# Patient Record
Sex: Female | Born: 1975 | Hispanic: Yes | Marital: Married | Smoking: Never smoker
Health system: Southern US, Community
[De-identification: ages and names within clinical notes are randomized; demographics above are authoritative.]

---

## 2006-07-02 ENCOUNTER — Ambulatory Visit: Payer: Self-pay | Admitting: Family Medicine

## 2006-10-26 ENCOUNTER — Inpatient Hospital Stay: Payer: Self-pay

## 2012-05-20 ENCOUNTER — Emergency Department: Payer: Self-pay | Admitting: Emergency Medicine

## 2012-05-20 LAB — COMPREHENSIVE METABOLIC PANEL
Albumin: 4.1 g/dL (ref 3.4–5.0)
Anion Gap: 9 (ref 7–16)
BUN: 7 mg/dL (ref 7–18)
Bilirubin,Total: 0.3 mg/dL (ref 0.2–1.0)
Calcium, Total: 8.8 mg/dL (ref 8.5–10.1)
Chloride: 107 mmol/L (ref 98–107)
Co2: 25 mmol/L (ref 21–32)
EGFR (African American): >60
Glucose: 88 mg/dL (ref 65–99)
Osmolality: 279 (ref 275–301)
Potassium: 3.3 mmol/L — ABNORMAL LOW (ref 3.5–5.1)
SGOT(AST): 16 U/L (ref 15–37)
Sodium: 141 mmol/L (ref 136–145)
Total Protein: 7.8 g/dL (ref 6.4–8.2)

## 2012-05-20 LAB — URINALYSIS, COMPLETE
Bacteria: NONE SEEN
Bilirubin,UR: NEGATIVE
Blood: NEGATIVE
Glucose,UR: NEGATIVE mg/dL (ref 0–75)
Squamous Epithelial: 3

## 2012-05-20 LAB — CBC WITH DIFFERENTIAL/PLATELET
Basophil #: 0 10*3/uL (ref 0.0–0.1)
Basophil %: 0.3 %
Eosinophil #: 0.1 10*3/uL (ref 0.0–0.7)
Eosinophil %: 0.5 %
HGB: 14.4 g/dL (ref 12.0–16.0)
Lymphocyte %: 17.5 %
Monocyte %: 5.9 %
Neutrophil #: 8 10*3/uL — ABNORMAL HIGH (ref 1.4–6.5)
Neutrophil %: 75.8 %
RBC: 5.08 10*6/uL (ref 3.80–5.20)
RDW: 13.1 % (ref 11.5–14.5)

## 2012-05-20 LAB — LIPASE, BLOOD: Lipase: 137 U/L (ref 73–393)

## 2018-11-18 ENCOUNTER — Other Ambulatory Visit: Payer: Self-pay

## 2018-11-18 ENCOUNTER — Encounter (INDEPENDENT_AMBULATORY_CARE_PROVIDER_SITE_OTHER): Payer: Self-pay

## 2018-11-18 ENCOUNTER — Ambulatory Visit: Payer: Self-pay | Attending: Oncology

## 2018-11-18 VITALS — BP 111/74 | HR 79 | Temp 98.1°F | Ht 61.0 in | Wt 104.0 lb

## 2018-11-18 DIAGNOSIS — N63 Unspecified lump in unspecified breast: Secondary | ICD-10-CM

## 2018-11-18 NOTE — Progress Notes (Signed)
  Subjective:     Patient ID: Madeline Ryan, female   DOB: 05-Nov-1975, 43 y.o.   MRN: [id number]  HPI   Review of Systems     Objective:   Physical Exam Chest:     Breasts:        Right: Mass present. No swelling, bleeding, inverted nipple, nipple discharge, skin change or tenderness.        Left: No swelling, bleeding, inverted nipple, mass, nipple discharge, skin change or tenderness.          Assessment:     43 year old hispanic patient presents for [facility] clinic visit.  Referred to [facility] from [facility] for 2 cm. breast mass in right breast.   Patient screened, and meets [facility] eligibility.  Patient does not have insurance, Medicare or Medicaid.  Handout given on Affordable Care Act.  Instructed patient on breast self awareness using teach back method.  Patient reports mass has been there and unchanged for about a year. Palpated a 2 cm. Firm mobile Mass at 12 o'clock right breast.    Plan:     Scheduled for diagnostic mammogram, and ultrasound on 11/19/2018.

## 2018-11-19 ENCOUNTER — Ambulatory Visit
Admission: RE | Admit: 2018-11-19 | Discharge: 2018-11-19 | Disposition: A | Discharge status: Home / Self Care | Payer: Self-pay | Source: Ambulatory Visit | Attending: Oncology | Admitting: Oncology

## 2018-11-19 DIAGNOSIS — N63 Unspecified lump in unspecified breast: Secondary | ICD-10-CM | POA: Insufficient documentation

## 2018-11-19 HISTORY — PX: BREAST BIOPSY: SHX20

## 2018-11-20 ENCOUNTER — Other Ambulatory Visit: Payer: Self-pay

## 2018-11-20 DIAGNOSIS — N63 Unspecified lump in unspecified breast: Secondary | ICD-10-CM

## 2018-11-20 NOTE — Progress Notes (Signed)
Orders in for US guided right breast biopsy.

## 2018-11-25 ENCOUNTER — Other Ambulatory Visit: Payer: Self-pay

## 2018-11-25 ENCOUNTER — Ambulatory Visit
Admission: RE | Admit: 2018-11-25 | Discharge: 2018-11-25 | Disposition: A | Discharge status: Home / Self Care | Payer: Self-pay | Source: Ambulatory Visit | Attending: Oncology | Admitting: Oncology

## 2018-11-25 DIAGNOSIS — N63 Unspecified lump in unspecified breast: Secondary | ICD-10-CM

## 2018-11-26 LAB — SURGICAL PATHOLOGY

## 2018-11-28 NOTE — Progress Notes (Addendum)
Radiologist notified patient of benign biopsy results.  Patient to return in one year for annual screening.  Copy to [facility].

## 2019-11-22 ENCOUNTER — Ambulatory Visit: Payer: Self-pay | Attending: Internal Medicine

## 2019-11-22 DIAGNOSIS — Z23 Encounter for immunization: Secondary | ICD-10-CM

## 2019-11-22 NOTE — Progress Notes (Signed)
   Covid-19 Vaccination Clinic  Name:  Siren Porrata    MRN: [id number] DOB: 09/21/1975  11/22/2019  Ms. Tobin Cadiente was observed post Covid-19 immunization for 15 minutes without incident. She was provided with Vaccine Information Sheet and instruction to access the V-Safe system.   Ms. Erminie Foulks was instructed to call 911 with any severe reactions post vaccine: Marland Kitchen Difficulty breathing  . Swelling of face and throat  . A fast heartbeat  . A bad rash all over body  . Dizziness and weakness   Immunizations Administered    Name Date Dose VIS Date Route   Pfizer COVID-19 Vaccine 11/22/2019  3:57 PM 0.3 mL 08/15/2019 Intramuscular   Manufacturer: [employer], [employer]   Lot: [id number]   NDC: [address]-7

## 2019-12-13 ENCOUNTER — Ambulatory Visit: Payer: Self-pay | Attending: Internal Medicine

## 2019-12-13 DIAGNOSIS — Z23 Encounter for immunization: Secondary | ICD-10-CM

## 2019-12-13 NOTE — Progress Notes (Signed)
   Covid-19 Vaccination Clinic  Name:  Madeline Ryan    MRN: [id number] DOB: 27-Nov-1975  12/13/2019  Ms. Madeline Ryan was observed post Covid-19 immunization for 15 minutes without incident. She was provided with Vaccine Information Sheet and instruction to access the V-Safe system.   Ms. Madeline Ryan was instructed to call 911 with any severe reactions post vaccine: Marland Kitchen Difficulty breathing  . Swelling of face and throat  . A fast heartbeat  . A bad rash all over body  . Dizziness and weakness   Immunizations Administered    Name Date Dose VIS Date Route   Pfizer COVID-19 Vaccine 12/13/2019  3:00 PM 0.3 mL 08/15/2019 Intramuscular   Manufacturer: [employer], [employer]   Lot: [id number]   NDC: [address]-5

## 2020-07-22 IMAGING — MG MM BREAST LOCALIZATION CLIP RIGHT
2 series · 2 of 2 positions shown · non-contrast
Comparison: Previous exam(s).

CLINICAL DATA: Evaluate biopsy marker

EXAM:
DIAGNOSTIC RIGHT MAMMOGRAM POST ULTRASOUND BIOPSY

[R CC]
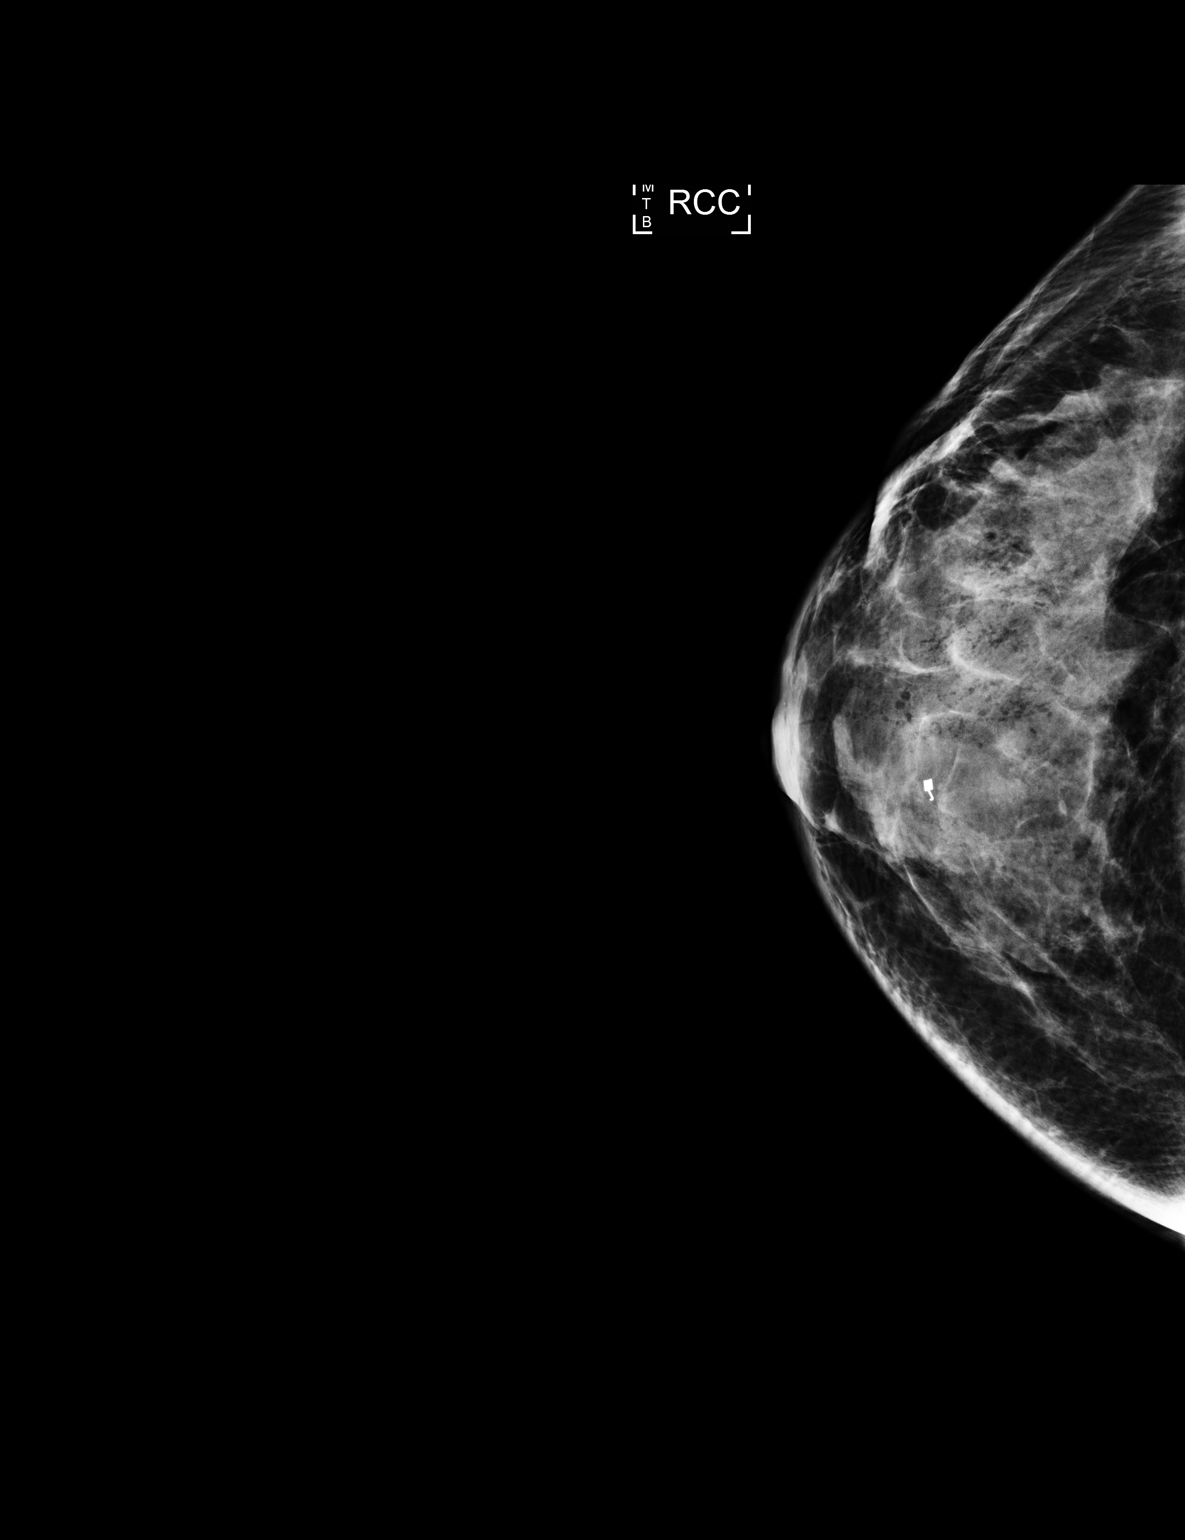

[R ML]
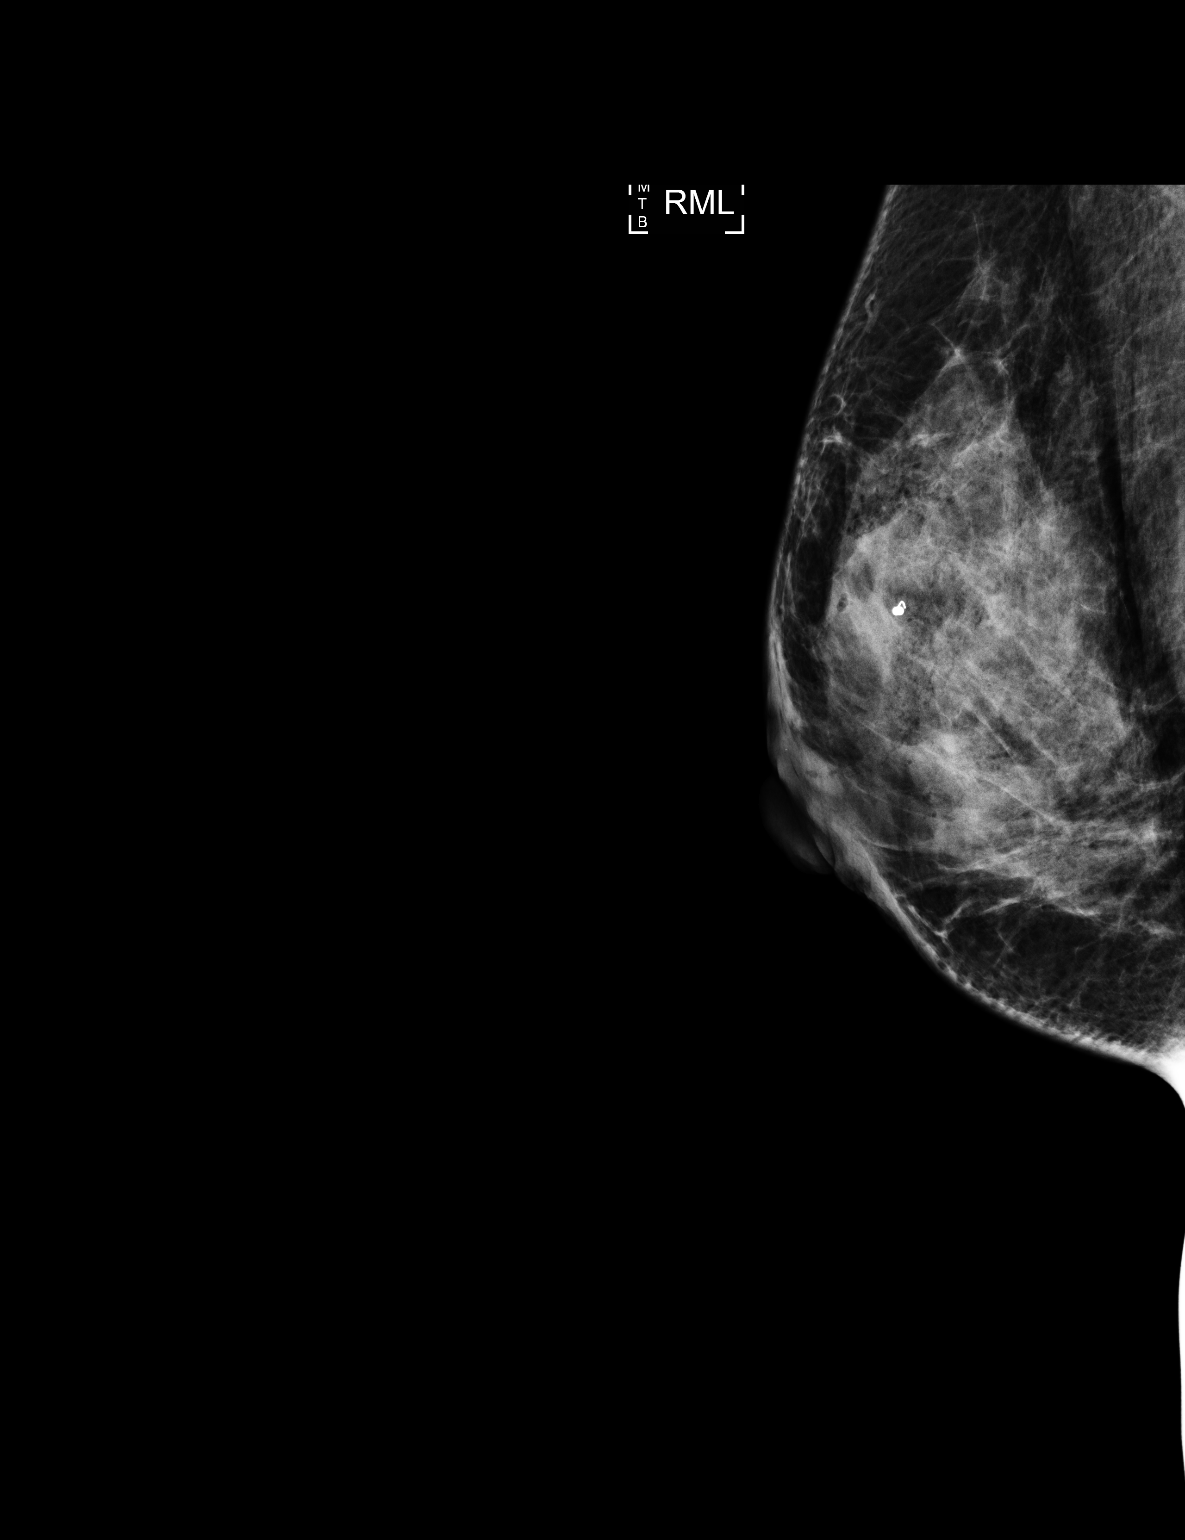

[2 of 2 positions shown; findings below may reference images not displayed]

FINDINGS: Mammographic images were obtained following ultrasound guided biopsy
of a 12 o'clock right breast mass. Coil shaped clip is at the site
of the biopsied mass.
IMPRESSION: Appropriate clip placement as above.

Final Assessment: Post Procedure Mammograms for Marker Placement

## 2022-03-20 ENCOUNTER — Other Ambulatory Visit: Payer: Self-pay

## 2022-03-20 DIAGNOSIS — Z1231 Encounter for screening mammogram for malignant neoplasm of breast: Secondary | ICD-10-CM

## 2022-03-21 ENCOUNTER — Ambulatory Visit: Payer: Self-pay | Attending: Hematology and Oncology | Admitting: *Deleted

## 2022-03-21 ENCOUNTER — Ambulatory Visit
Admission: RE | Admit: 2022-03-21 | Discharge: 2022-03-21 | Disposition: A | Discharge status: Home / Self Care | Payer: Self-pay | Source: Ambulatory Visit | Attending: Obstetrics and Gynecology | Admitting: Obstetrics and Gynecology

## 2022-03-21 VITALS — BP 106/66 | Wt 114.9 lb

## 2022-03-21 DIAGNOSIS — Z1231 Encounter for screening mammogram for malignant neoplasm of breast: Secondary | ICD-10-CM | POA: Insufficient documentation

## 2022-03-21 DIAGNOSIS — Z01419 Encounter for gynecological examination (general) (routine) without abnormal findings: Secondary | ICD-10-CM

## 2022-03-21 NOTE — Progress Notes (Signed)
Madeline Ryan is a 46 y.o. female who presents to [facility] clinic today with no complaints. She presents for clinical breast exam and mammogram.    Pap Smear: Pap not smear completed today. Last Pap smear was 01/24/22 at the [facility] clinic and was  HPV negative / ASCUS . Per patient has no history of an abnormal Pap smear. Last Pap smear result is available in Epic.   Physical exam: Breasts Breasts symmetrical. No skin abnormalities bilateral breasts. No nipple retraction bilateral breasts. No nipple discharge bilateral breasts. No lymphadenopathy. No lumps palpated bilateral breasts.       Pelvic/Bimanual Pap is not indicated today    Smoking History: Patient has never smoked     Patient Navigation: Patient education provided. Access to services provided for patient through [facility] program. Kandis Cocking, the [facility] interpreter provided interpretation. No transportation provided   Colorectal Cancer Screening: Per patient had a negative FIT test on 02/01/22. No complaints today.    Breast and Cervical Cancer Risk Assessment: Patient does not have family history of breast cancer, known genetic mutations, or radiation treatment to the chest before age 61. Patient does not have history of cervical dysplasia, immunocompromised, or DES exposure in-utero.  Risk Assessment     Risk Scores       03/21/2022 11/18/2018   Last edited by: Narda Rutherford, LPN Scarlett Presto, RN   5-year risk: 0.5 % 0.3 %   Lifetime risk: 5.6 % 5.8 %            A: [facility] exam without pap smear  P: Referred patient to the [facility] for a screening mammogram. Appointment scheduled for today.  Jim Like, RN 03/21/2022 1:40 PM
